# Patient Record
Sex: Female | Born: 1945 | Race: White | Hispanic: No | Marital: Married | Smoking: Never smoker
Health system: Southern US, Community
[De-identification: ages and names within clinical notes are randomized; demographics above are authoritative.]

## PROBLEM LIST (undated history)

## (undated) DIAGNOSIS — F32A Depression, unspecified: Secondary | ICD-10-CM

## (undated) DIAGNOSIS — I10 Essential (primary) hypertension: Secondary | ICD-10-CM

## (undated) DIAGNOSIS — R011 Cardiac murmur, unspecified: Secondary | ICD-10-CM

## (undated) DIAGNOSIS — J189 Pneumonia, unspecified organism: Secondary | ICD-10-CM

## (undated) DIAGNOSIS — M1990 Unspecified osteoarthritis, unspecified site: Secondary | ICD-10-CM

## (undated) DIAGNOSIS — D649 Anemia, unspecified: Secondary | ICD-10-CM

## (undated) DIAGNOSIS — G4730 Sleep apnea, unspecified: Secondary | ICD-10-CM

## (undated) DIAGNOSIS — C801 Malignant (primary) neoplasm, unspecified: Secondary | ICD-10-CM

## (undated) DIAGNOSIS — R519 Headache, unspecified: Secondary | ICD-10-CM

## (undated) HISTORY — PX: APPENDECTOMY: SHX54

## (undated) HISTORY — PX: COLONOSCOPY: SHX174

## (undated) HISTORY — PX: EYE SURGERY: SHX253

## (undated) HISTORY — PX: COLON SURGERY: SHX602

## (undated) HISTORY — PX: HERNIA REPAIR: SHX51

---

## 2009-08-14 ENCOUNTER — Ambulatory Visit: Payer: Self-pay | Admitting: Diagnostic Radiology

## 2009-08-14 ENCOUNTER — Emergency Department (HOSPITAL_BASED_OUTPATIENT_CLINIC_OR_DEPARTMENT_OTHER): Admission: EM | Admit: 2009-08-14 | Discharge: 2009-08-14 | Payer: Self-pay | Admitting: Emergency Medicine

## 2010-06-26 IMAGING — CR DG CHEST 2V
2 series · 2 of 2 positions shown · non-contrast
Comparison: None available.

CLINICAL DATA: Elevated blood pressure, nausea and heart
palpitations.

CHEST - 2 VIEW

[w chest pa]
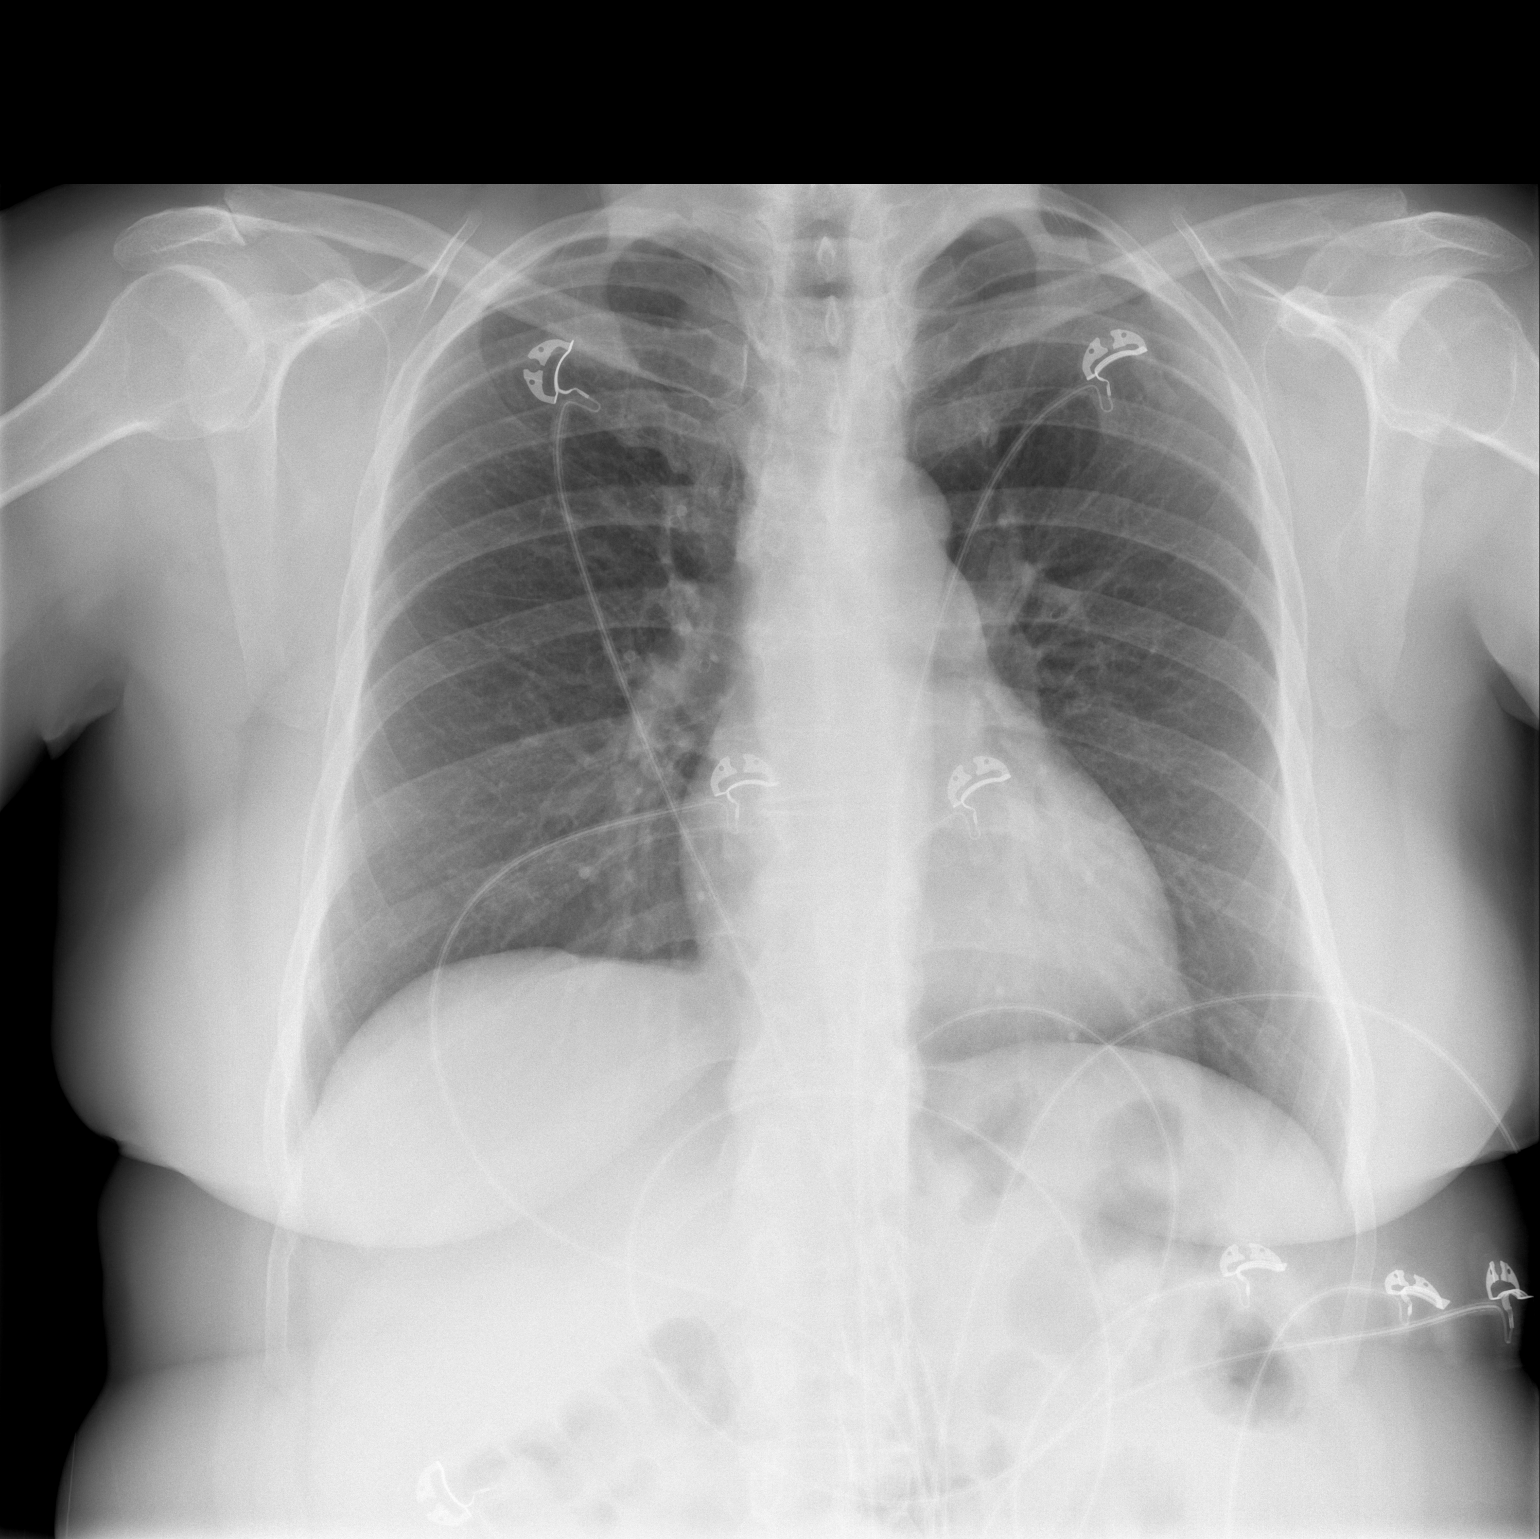

[w chest lat]
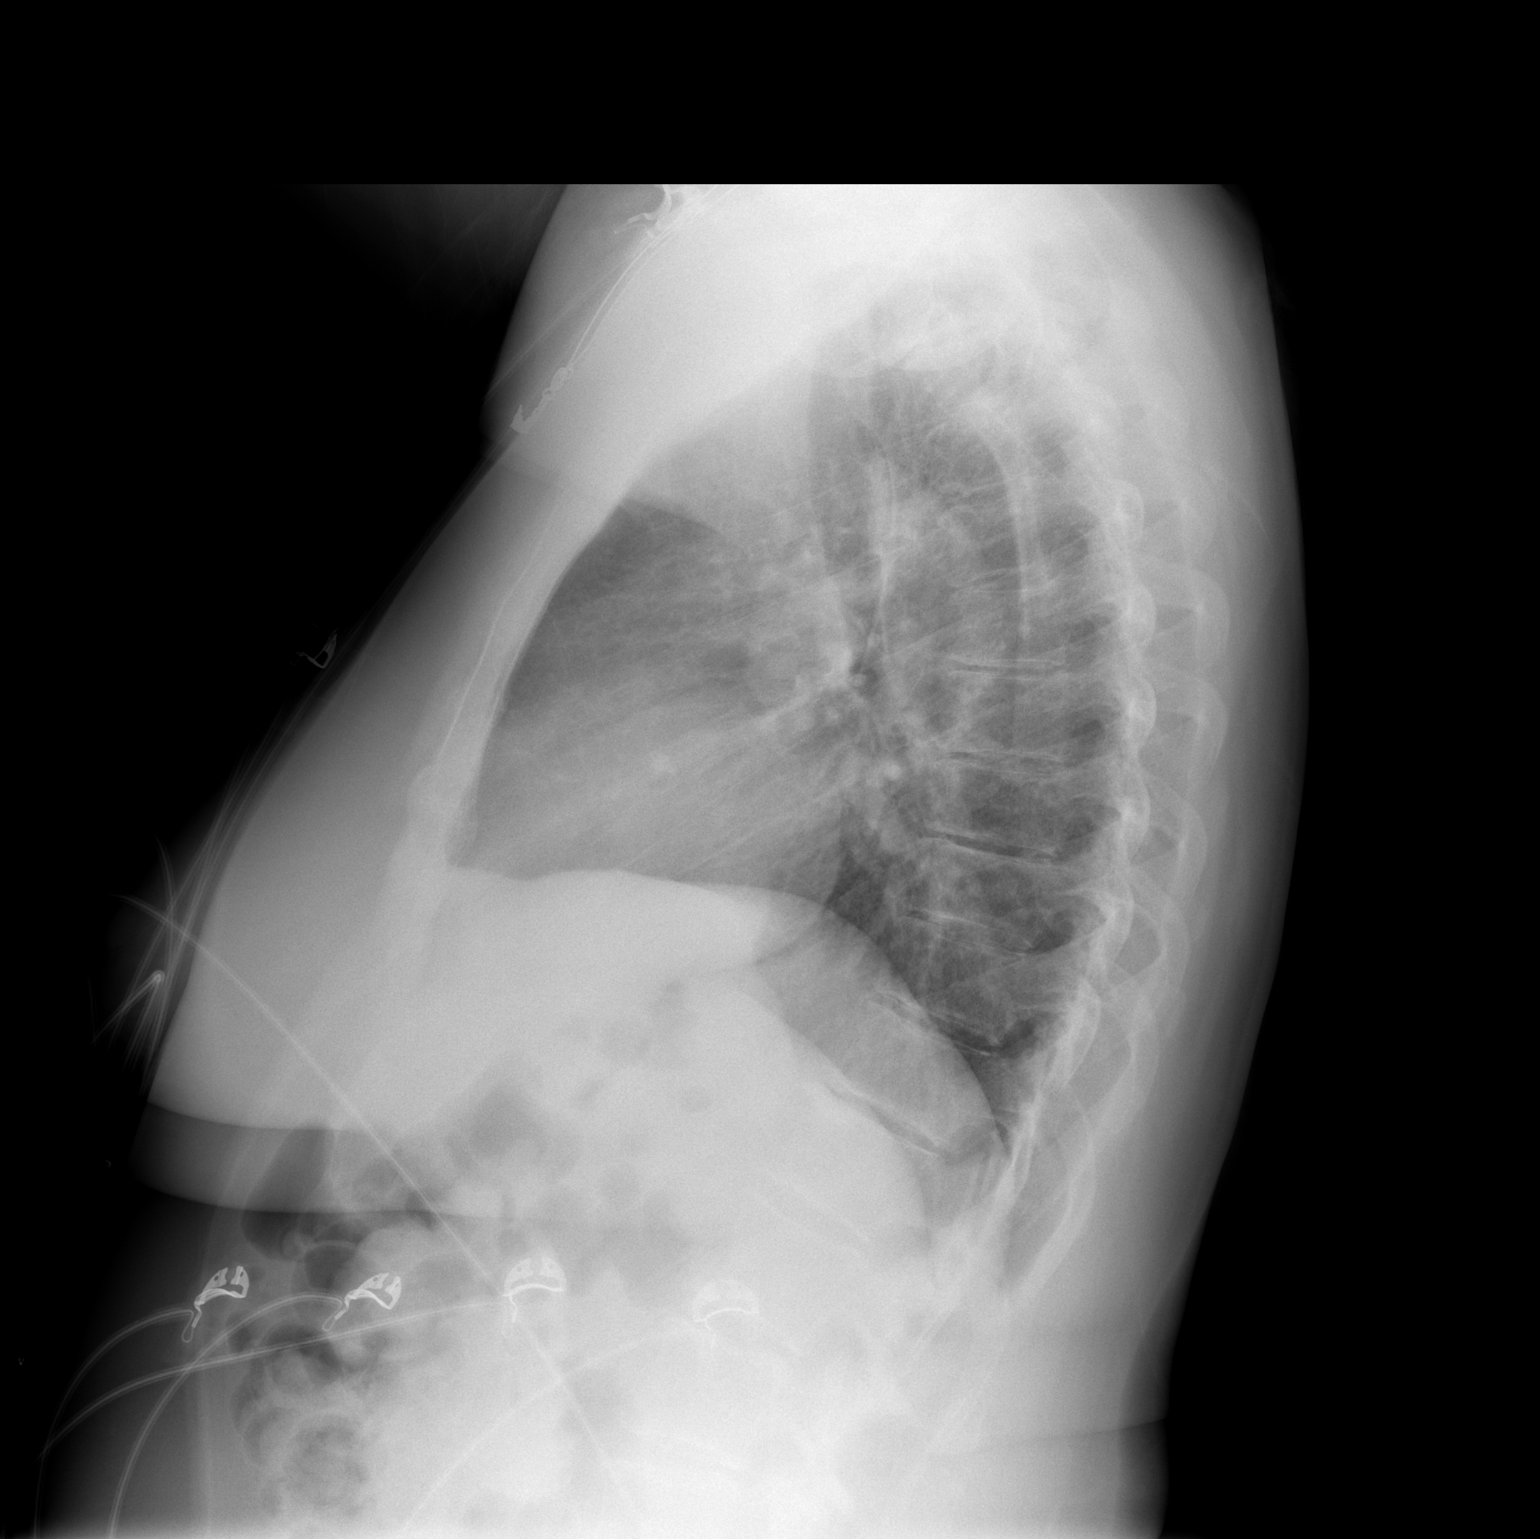

[2 of 2 positions shown; findings below may reference images not displayed]

FINDINGS: The lungs are clear.  Heart size is normal.  There is no
pleural effusion.
IMPRESSION: Negative chest.

## 2010-11-25 LAB — CBC
HCT: 38.4 % (ref 36.0–46.0)
MCHC: 34.5 g/dL (ref 30.0–36.0)
MCV: 91.7 fL (ref 78.0–100.0)
Platelets: 263 10*3/uL (ref 150–400)
WBC: 7.7 10*3/uL (ref 4.0–10.5)

## 2010-11-25 LAB — DIFFERENTIAL
Basophils Relative: 0 % (ref 0–1)
Eosinophils Absolute: 0 10*3/uL (ref 0.0–0.7)
Eosinophils Relative: 1 % (ref 0–5)
Lymphs Abs: 1.1 10*3/uL (ref 0.7–4.0)
Monocytes Relative: 5 % (ref 3–12)

## 2010-11-25 LAB — BASIC METABOLIC PANEL
BUN: 15 mg/dL (ref 6–23)
CO2: 27 mEq/L (ref 19–32)
Chloride: 105 mEq/L (ref 96–112)
Creatinine, Ser: 0.8 mg/dL (ref 0.4–1.2)
Potassium: 4.2 mEq/L (ref 3.5–5.1)

## 2010-11-25 LAB — POCT CARDIAC MARKERS

## 2019-11-30 ENCOUNTER — Telehealth: Payer: Self-pay

## 2019-12-01 NOTE — Telephone Encounter (Signed)
Encounter opened in error

## 2021-01-25 ENCOUNTER — Encounter (HOSPITAL_COMMUNITY): Payer: Self-pay | Admitting: Ophthalmology

## 2021-01-25 ENCOUNTER — Other Ambulatory Visit: Payer: Self-pay

## 2021-01-25 NOTE — Progress Notes (Signed)
Spoke with pt for pre-op call. Pt denies cardiac history (except for a heart murmur that she has had for 70+ years). Pt states she has been treated for HTN in the past but has not been on medications for several years. Pt is not diabetic.   Pt's surgery is scheduled as ambulatory so no Covid test is required prior to surgery. Pt denies any Covid symptoms.

## 2021-01-29 ENCOUNTER — Other Ambulatory Visit: Payer: Self-pay

## 2021-01-29 ENCOUNTER — Ambulatory Visit (HOSPITAL_COMMUNITY): Payer: Medicare HMO | Admitting: Anesthesiology

## 2021-01-29 ENCOUNTER — Encounter (HOSPITAL_COMMUNITY)
Admission: RE | Disposition: A | Discharge status: Home / Self Care | Payer: Self-pay | Source: Ambulatory Visit | Attending: Ophthalmology

## 2021-01-29 ENCOUNTER — Encounter (HOSPITAL_COMMUNITY): Payer: Self-pay | Admitting: Ophthalmology

## 2021-01-29 ENCOUNTER — Ambulatory Visit (HOSPITAL_COMMUNITY)
Admission: RE | Admit: 2021-01-29 | Discharge: 2021-01-29 | Disposition: A | Discharge status: Home / Self Care | Payer: Medicare HMO | Source: Ambulatory Visit | Attending: Ophthalmology | Admitting: Ophthalmology

## 2021-01-29 ENCOUNTER — Ambulatory Visit: Payer: Self-pay | Admitting: Ophthalmology

## 2021-01-29 DIAGNOSIS — R899 Unspecified abnormal finding in specimens from other organs, systems and tissues: Secondary | ICD-10-CM | POA: Diagnosis not present

## 2021-01-29 DIAGNOSIS — H43391 Other vitreous opacities, right eye: Secondary | ICD-10-CM | POA: Diagnosis present

## 2021-01-29 HISTORY — DX: Headache, unspecified: R51.9

## 2021-01-29 HISTORY — DX: Unspecified osteoarthritis, unspecified site: M19.90

## 2021-01-29 HISTORY — DX: Pneumonia, unspecified organism: J18.9

## 2021-01-29 HISTORY — DX: Cardiac murmur, unspecified: R01.1

## 2021-01-29 HISTORY — DX: Malignant (primary) neoplasm, unspecified: C80.1

## 2021-01-29 HISTORY — DX: Anemia, unspecified: D64.9

## 2021-01-29 HISTORY — DX: Sleep apnea, unspecified: G47.30

## 2021-01-29 HISTORY — DX: Depression, unspecified: F32.A

## 2021-01-29 HISTORY — PX: PHOTOCOAGULATION WITH LASER: SHX6027

## 2021-01-29 HISTORY — DX: Essential (primary) hypertension: I10

## 2021-01-29 HISTORY — PX: PARS PLANA VITRECTOMY: SHX2166

## 2021-01-29 LAB — BASIC METABOLIC PANEL
Anion gap: 8 (ref 5–15)
BUN: 19 mg/dL (ref 8–23)
CO2: 23 mmol/L (ref 22–32)
Calcium: 9.4 mg/dL (ref 8.9–10.3)
Chloride: 107 mmol/L (ref 98–111)
Creatinine, Ser: 0.98 mg/dL (ref 0.44–1.00)
GFR, Estimated: >60 mL/min (ref >60)
Glucose, Bld: 101 mg/dL — ABNORMAL HIGH (ref 70–99)
Potassium: 3.7 mmol/L (ref 3.5–5.1)
Sodium: 138 mmol/L (ref 135–145)

## 2021-01-29 SURGERY — PARS PLANA VITRECTOMY 25 GAUGE FOR ENDOPHTHALMITIS
Anesthesia: Monitor Anesthesia Care | Site: Eye | Laterality: Right

## 2021-01-29 MED ORDER — PROPOFOL 10 MG/ML IV BOLUS
INTRAVENOUS | Status: DC | PRN
  Administered 2021-01-29: 20 mg via INTRAVENOUS
  Administered 2021-01-29: 25 mg via INTRAVENOUS
  Administered 2021-01-29: 15 mg via INTRAVENOUS
  Administered 2021-01-29: 20 mg via INTRAVENOUS

## 2021-01-29 MED ORDER — ATROPINE SULFATE 1 % OP SOLN
OPHTHALMIC | Status: AC
  Filled 2021-01-29: qty 5

## 2021-01-29 MED ORDER — EPINEPHRINE PF 1 MG/ML IJ SOLN
INTRAOCULAR | Status: DC | PRN
  Administered 2021-01-29: 500 mL

## 2021-01-29 MED ORDER — CHLORHEXIDINE GLUCONATE 0.12 % MT SOLN
15.0000 mL | Freq: Once | OROMUCOSAL | Status: AC
  Administered 2021-01-29: 15 mL via OROMUCOSAL
  Filled 2021-01-29: qty 15

## 2021-01-29 MED ORDER — ORAL CARE MOUTH RINSE: 15.0000 mL | Freq: Once | OROMUCOSAL | Status: AC

## 2021-01-29 MED ORDER — ATROPINE SULFATE 1 % OP SOLN
OPHTHALMIC | Status: DC | PRN
  Administered 2021-01-29: 1 [drp] via OPHTHALMIC

## 2021-01-29 MED ORDER — PHENYLEPHRINE 40 MCG/ML (10ML) SYRINGE FOR IV PUSH (FOR BLOOD PRESSURE SUPPORT)
PREFILLED_SYRINGE | INTRAVENOUS | Status: AC
  Filled 2021-01-29: qty 10

## 2021-01-29 MED ORDER — DEXAMETHASONE SODIUM PHOSPHATE 10 MG/ML IJ SOLN
INTRAMUSCULAR | Status: AC
  Filled 2021-01-29: qty 1

## 2021-01-29 MED ORDER — FENTANYL CITRATE (PF) 100 MCG/2ML IJ SOLN
INTRAMUSCULAR | Status: AC
  Filled 2021-01-29: qty 2

## 2021-01-29 MED ORDER — EPINEPHRINE PF 1 MG/ML IJ SOLN
INTRAMUSCULAR | Status: AC
  Filled 2021-01-29: qty 1

## 2021-01-29 MED ORDER — FENTANYL CITRATE (PF) 250 MCG/5ML IJ SOLN
INTRAMUSCULAR | Status: AC
  Filled 2021-01-29: qty 5

## 2021-01-29 MED ORDER — FENTANYL CITRATE (PF) 100 MCG/2ML IJ SOLN
25.0000 ug | INTRAMUSCULAR | Status: DC | PRN
  Administered 2021-01-29 (×2): 25 ug via INTRAVENOUS

## 2021-01-29 MED ORDER — BSS IO SOLN
INTRAOCULAR | Status: DC | PRN
  Administered 2021-01-29: 15 mL

## 2021-01-29 MED ORDER — INDOCYANINE GREEN 25 MG IV SOLR
INTRAVENOUS | Status: AC
  Filled 2021-01-29: qty 10

## 2021-01-29 MED ORDER — PROPOFOL 10 MG/ML IV BOLUS
INTRAVENOUS | Status: AC
  Filled 2021-01-29: qty 20

## 2021-01-29 MED ORDER — OXYCODONE HCL 5 MG PO TABS: 5.0000 mg | ORAL_TABLET | Freq: Once | ORAL | Status: DC | PRN

## 2021-01-29 MED ORDER — CEFAZOLIN SUBCONJUNCTIVAL INJECTION 100 MG/0.5 ML
100.0000 mg | INJECTION | SUBCONJUNCTIVAL | Status: DC
  Filled 2021-01-29: qty 5

## 2021-01-29 MED ORDER — TETRACAINE HCL 0.5 % OP SOLN
OPHTHALMIC | Status: AC
  Filled 2021-01-29: qty 4

## 2021-01-29 MED ORDER — MIDAZOLAM HCL 5 MG/5ML IJ SOLN
INTRAMUSCULAR | Status: DC | PRN
  Administered 2021-01-29 (×2): 1 mg via INTRAVENOUS

## 2021-01-29 MED ORDER — MIDAZOLAM HCL 2 MG/2ML IJ SOLN
INTRAMUSCULAR | Status: AC
  Filled 2021-01-29: qty 2

## 2021-01-29 MED ORDER — OXYCODONE HCL 5 MG/5ML PO SOLN: 5.0000 mg | Freq: Once | ORAL | Status: DC | PRN

## 2021-01-29 MED ORDER — TOBRAMYCIN-DEXAMETHASONE 0.3-0.1 % OP OINT
TOPICAL_OINTMENT | OPHTHALMIC | Status: DC | PRN
  Administered 2021-01-29: 1 via OPHTHALMIC

## 2021-01-29 MED ORDER — ONDANSETRON HCL 4 MG/2ML IJ SOLN
INTRAMUSCULAR | Status: DC | PRN
  Administered 2021-01-29: 4 mg via INTRAVENOUS

## 2021-01-29 MED ORDER — FENTANYL CITRATE (PF) 100 MCG/2ML IJ SOLN
INTRAMUSCULAR | Status: DC | PRN
  Administered 2021-01-29: 50 ug via INTRAVENOUS
  Administered 2021-01-29 (×3): 25 ug via INTRAVENOUS

## 2021-01-29 MED ORDER — OFLOXACIN 0.3 % OP SOLN
1.0000 [drp] | OPHTHALMIC | Status: DC | PRN
  Administered 2021-01-29: 1 [drp] via OPHTHALMIC
  Filled 2021-01-29: qty 5

## 2021-01-29 MED ORDER — ONDANSETRON HCL 4 MG/2ML IJ SOLN: 4.0000 mg | Freq: Once | INTRAMUSCULAR | Status: DC | PRN

## 2021-01-29 MED ORDER — HYALURONIDASE HUMAN 150 UNIT/ML IJ SOLN
INTRAMUSCULAR | Status: AC
  Filled 2021-01-29: qty 1

## 2021-01-29 MED ORDER — LIDOCAINE HCL 2 % IJ SOLN
INTRAMUSCULAR | Status: AC
  Filled 2021-01-29: qty 20

## 2021-01-29 MED ORDER — VANCOMYCIN SUBCONJUNCTIVAL INJECTION 25 MG/0.5 ML
25.0000 mg | INTRAOCULAR | Status: AC
  Administered 2021-01-29: 25 mg via SUBCONJUNCTIVAL
  Filled 2021-01-29: qty 0

## 2021-01-29 MED ORDER — PROPARACAINE HCL 0.5 % OP SOLN
1.0000 [drp] | OPHTHALMIC | Status: DC | PRN
  Administered 2021-01-29: 1 [drp] via OPHTHALMIC
  Filled 2021-01-29: qty 15

## 2021-01-29 MED ORDER — TOBRAMYCIN-DEXAMETHASONE 0.3-0.1 % OP OINT
TOPICAL_OINTMENT | OPHTHALMIC | Status: AC
  Filled 2021-01-29: qty 3.5

## 2021-01-29 MED ORDER — CEFTAZIDIME INTRAVITREAL INJECTION 2.25 MG/0.1 ML
2.2500 mg | INTRAVITREAL | Status: DC
  Filled 2021-01-29 (×2): qty 0.1

## 2021-01-29 MED ORDER — PHENYLEPHRINE HCL 2.5 % OP SOLN
1.0000 [drp] | OPHTHALMIC | Status: DC | PRN
  Administered 2021-01-29: 1 [drp] via OPHTHALMIC
  Filled 2021-01-29: qty 2

## 2021-01-29 MED ORDER — SODIUM CHLORIDE 0.9 % IV SOLN
1.0000 g | Freq: Once | INTRAVENOUS | Status: DC
  Filled 2021-01-29 (×2): qty 1

## 2021-01-29 MED ORDER — CYCLOPENTOLATE HCL 1 % OP SOLN
1.0000 [drp] | OPHTHALMIC | Status: DC | PRN
  Administered 2021-01-29: 1 [drp] via OPHTHALMIC
  Filled 2021-01-29: qty 2

## 2021-01-29 MED ORDER — DEXAMETHASONE SODIUM PHOSPHATE 10 MG/ML IJ SOLN
INTRAMUSCULAR | Status: DC | PRN
  Administered 2021-01-29: 1 mL

## 2021-01-29 MED ORDER — HYPROMELLOSE (GONIOSCOPIC) 2.5 % OP SOLN
OPHTHALMIC | Status: DC | PRN
  Administered 2021-01-29: 2 [drp] via OPHTHALMIC

## 2021-01-29 MED ORDER — AMISULPRIDE (ANTIEMETIC) 5 MG/2ML IV SOLN: 10.0000 mg | Freq: Once | INTRAVENOUS | Status: DC | PRN

## 2021-01-29 MED ORDER — SODIUM CHLORIDE 0.9 % IV SOLN: INTRAVENOUS | Status: DC

## 2021-01-29 MED ORDER — BUPIVACAINE HCL (PF) 0.75 % IJ SOLN
INTRAMUSCULAR | Status: AC
  Filled 2021-01-29: qty 10

## 2021-01-29 MED ORDER — BSS IO SOLN
INTRAOCULAR | Status: AC
  Filled 2021-01-29: qty 15

## 2021-01-29 MED ORDER — HYPROMELLOSE (GONIOSCOPIC) 2.5 % OP SOLN
OPHTHALMIC | Status: AC
  Filled 2021-01-29: qty 15

## 2021-01-29 MED ORDER — LIDOCAINE HCL 2 % IJ SOLN
INTRAMUSCULAR | Status: DC | PRN
  Administered 2021-01-29: 6 mL via RETROBULBAR

## 2021-01-29 MED ORDER — BSS PLUS IO SOLN
INTRAOCULAR | Status: AC
  Filled 2021-01-29: qty 500

## 2021-01-29 MED ORDER — ONDANSETRON HCL 4 MG/2ML IJ SOLN
INTRAMUSCULAR | Status: AC
  Filled 2021-01-29: qty 2

## 2021-01-29 SURGICAL SUPPLY — 62 items
APL SWBSTK 6 STRL LF DISP (MISCELLANEOUS) ×1
APPLICATOR COTTON TIP 6 STRL (MISCELLANEOUS) ×1 IMPLANT
APPLICATOR COTTON TIP 6IN STRL (MISCELLANEOUS) ×2
BAND WRIST GAS GREEN (MISCELLANEOUS) IMPLANT
BLADE MVR KNIFE 20G (BLADE) IMPLANT
CANNULA ANT CHAM MAIN (OPHTHALMIC RELATED) IMPLANT
CANNULA DUAL BORE 23G (CANNULA) IMPLANT
CANNULA DUALBORE 25G (CANNULA) IMPLANT
CANNULA VLV SOFT TIP 25G (OPHTHALMIC) ×1 IMPLANT
CANNULA VLV SOFT TIP 25GA (OPHTHALMIC) ×2 IMPLANT
CAUTERY EYE LOW TEMP 1300F FIN (OPHTHALMIC RELATED) IMPLANT
CLSR STERI-STRIP ANTIMIC 1/2X4 (GAUZE/BANDAGES/DRESSINGS) ×2 IMPLANT
CORD BIPOLAR FORCEPS 12FT (ELECTRODE) IMPLANT
COVER MAYO STAND STRL (DRAPES) IMPLANT
COVER WAND RF STERILE (DRAPES) ×2 IMPLANT
DRAPE HALF SHEET 40X57 (DRAPES) ×2 IMPLANT
DRAPE INCISE 51X51 W/FILM STRL (DRAPES) IMPLANT
DRAPE RETRACTOR (MISCELLANEOUS) ×2 IMPLANT
ERASER HMR WETFIELD 23G BP (MISCELLANEOUS) IMPLANT
GAS WRIST BAND GREEN (MISCELLANEOUS)
GLOVE SURG SYN 7.5  E (GLOVE) ×4
GLOVE SURG SYN 7.5 E (GLOVE) ×2 IMPLANT
GLOVE SURG SYN 7.5 PF PI (GLOVE) ×2 IMPLANT
GOWN STRL REUS W/ TWL LRG LVL3 (GOWN DISPOSABLE) ×1 IMPLANT
GOWN STRL REUS W/TWL LRG LVL3 (GOWN DISPOSABLE) ×2
HANDLE PNEUMATIC FOR CONSTEL (OPHTHALMIC) IMPLANT
KIT BASIN OR (CUSTOM PROCEDURE TRAY) ×2 IMPLANT
KIT TURNOVER KIT B (KITS) ×2 IMPLANT
LENS BIOM SUPER VIEW SET DISP (MISCELLANEOUS) ×2 IMPLANT
MICROPICK 25G (MISCELLANEOUS)
NDL 18GX1X1/2 (RX/OR ONLY) (NEEDLE) ×1 IMPLANT
NDL 25GX 5/8IN NON SAFETY (NEEDLE) ×1 IMPLANT
NDL 27GX1/2 REG BEVEL ECLIP (NEEDLE) ×1 IMPLANT
NDL FILTER BLUNT 18X1 1/2 (NEEDLE) ×3 IMPLANT
NDL HYPO 25GX1X1/2 BEV (NEEDLE) IMPLANT
NDL HYPO 30X.5 LL (NEEDLE) ×4 IMPLANT
NDL RETROBULBAR 25GX1.5 (NEEDLE) IMPLANT
NEEDLE 18GX1X1/2 (RX/OR ONLY) (NEEDLE) ×2 IMPLANT
NEEDLE 25GX 5/8IN NON SAFETY (NEEDLE) ×2 IMPLANT
NEEDLE 27GX1/2 REG BEVEL ECLIP (NEEDLE) ×2 IMPLANT
NEEDLE FILTER BLUNT 18X 1/2SAF (NEEDLE) ×3
NEEDLE FILTER BLUNT 18X1 1/2 (NEEDLE) ×3 IMPLANT
NEEDLE HYPO 25GX1X1/2 BEV (NEEDLE) IMPLANT
NEEDLE HYPO 30X.5 LL (NEEDLE) ×8 IMPLANT
NEEDLE RETROBULBAR 25GX1.5 (NEEDLE) IMPLANT
NS IRRIG 1000ML POUR BTL (IV SOLUTION) ×2 IMPLANT
PACK VITRECTOMY CUSTOM (CUSTOM PROCEDURE TRAY) ×2 IMPLANT
PAD ARMBOARD 7.5X6 YLW CONV (MISCELLANEOUS) ×4 IMPLANT
PAK PIK VITRECTOMY CVS 25GA (OPHTHALMIC) ×2 IMPLANT
PICK MICROPICK 25G (MISCELLANEOUS) IMPLANT
PROBE ENDO DIATHERMY 25G (MISCELLANEOUS) IMPLANT
PROBE LASER ILLUM FLEX CVD 25G (OPHTHALMIC) ×1 IMPLANT
SCRAPER DIAMOND 25GA (OPHTHALMIC RELATED) IMPLANT
SOL ANTI FOG 6CC (MISCELLANEOUS) ×1 IMPLANT
SOLUTION ANTI FOG 6CC (MISCELLANEOUS) ×1
SUT VICRYL 7 0 TG140 8 (SUTURE) ×2 IMPLANT
SYR 10ML LL (SYRINGE) IMPLANT
SYR 20ML LL LF (SYRINGE) ×2 IMPLANT
SYR 5ML LL (SYRINGE) ×2 IMPLANT
SYR TB 1ML LUER SLIP (SYRINGE) ×6 IMPLANT
TOWEL GREEN STERILE FF (TOWEL DISPOSABLE) ×2 IMPLANT
WATER STERILE IRR 1000ML POUR (IV SOLUTION) ×2 IMPLANT

## 2021-01-29 NOTE — Anesthesia Preprocedure Evaluation (Addendum)
Anesthesia Evaluation  Patient identified by MRN, date of birth, ID band Patient awake    Reviewed: Allergy & Precautions, NPO status , Patient's Chart, lab work & pertinent test results  History of Anesthesia Complications Negative for: history of anesthetic complications  Airway Mallampati: III  TM Distance: >3 FB Neck ROM: Full    Dental  (+) Teeth Intact   Pulmonary sleep apnea ,    Pulmonary exam normal        Cardiovascular hypertension, Normal cardiovascular exam     Neuro/Psych negative neurological ROS     GI/Hepatic negative GI ROS, Neg liver ROS,   Endo/Other  negative endocrine ROS  Renal/GU negative Renal ROS  negative genitourinary   Musculoskeletal  (+) Arthritis ,   Abdominal   Peds  Hematology H/o lymphoma   Anesthesia Other Findings   Reproductive/Obstetrics                            Anesthesia Physical Anesthesia Plan  ASA: II  Anesthesia Plan: MAC   Post-op Pain Management:    Induction: Intravenous  PONV Risk Score and Plan: 2 and TIVA and Treatment may vary due to age or medical condition  Airway Management Planned: Natural Airway, Nasal Cannula and Simple Face Mask  Additional Equipment: None  Intra-op Plan:   Post-operative Plan:   Informed Consent: I have reviewed the patients History and Physical, chart, labs and discussed the procedure including the risks, benefits and alternatives for the proposed anesthesia with the patient or authorized representative who has indicated his/her understanding and acceptance.       Plan Discussed with:   Anesthesia Plan Comments:        Anesthesia Quick Evaluation

## 2021-01-29 NOTE — H&P (Deleted)
  The note originally documented on this encounter has been moved the the encounter in which it belongs.  

## 2021-01-29 NOTE — Brief Op Note (Signed)
01/29/2021  4:33 PM  PATIENT:  Kristina Bishop  75 y.o. female  PRE-OPERATIVE DIAGNOSIS:  OTHER VITROUS OPACITIES RIGHT EYE  POST-OPERATIVE DIAGNOSIS:  OTHER VITROUS OPACITIES RIGHT EYE  PROCEDURE:  Procedure(s): PARS PLANA VITRECTOMY 25 GAUGE, VITREOUS BIOPSY AND ENDOLASER(Right)  SURGEON:  Surgeon(s) and Role:    * Jalene Mullet, MD - Primary  PHYSICIAN ASSISTANT:   ASSISTANTS: none   ANESTHESIA:   local and MAC  EBL:  MINIMAL   BLOOD ADMINISTERED:none  DRAINS: none   LOCAL MEDICATIONS USED:  MARCAINE    and LIDOCAINE   SPECIMEN:  Source of Specimen:  VITREOUS  DISPOSITION OF SPECIMEN:  PATHOLOGY FOR FLOW CYTOMETRY  COUNTS:  YES  TOURNIQUET:  * No tourniquets in log *  DICTATION: .Note written in EPIC  PLAN OF CARE: Discharge to home after PACU  PATIENT DISPOSITION:  PACU - hemodynamically stable.   Delay start of Pharmacological VTE agent (>24hrs) due to surgical blood loss or risk of bleeding: not applicable

## 2021-01-29 NOTE — Anesthesia Procedure Notes (Signed)
Procedure Name: MAC Date/Time: 01/29/2021 3:38 PM Performed by: Inda Coke, CRNA Pre-anesthesia Checklist: Patient identified, Emergency Drugs available, Suction available, Timeout performed and Patient being monitored Patient Re-evaluated:Patient Re-evaluated prior to induction Oxygen Delivery Method: Nasal cannula Induction Type: IV induction Dental Injury: Teeth and Oropharynx as per pre-operative assessment

## 2021-01-29 NOTE — Discharge Instructions (Addendum)
DO NOT SLEEP ON BACK, THE EYE PRESSURE CAN GO UP AND CAUSE VISION LOSS   SLEEP ON SIDE WITH NOSE TO PILLOW  DURING DAY KEEP UPRIGHT 

## 2021-01-29 NOTE — Op Note (Signed)
Kristina Bishop 01/29/2021 Diagnosis: Vitreous Opacities  Procedure: Pars Plana Vitrectomy, Endolaser and dry tap for vitreous biopsy Operative Eye:  right eye  Surgeon: Royston Cowper Estimated Blood Loss: minimal Specimens for Pathology:  None Complications: none   The  patient was prepped and draped in the usual fashion for ocular surgery on the  right eye .  A lid speculum was placed.  Infusion line and trocar was placed at the 8 o'clock position approximately 3.5 mm from the surgical limbus.   The infusion line was allowed to run and then clamped when placed at the cannula opening. The line was inserted and secured to the drape with an adhesive strip.   Active trocars/cannula were placed at the 10 and 2 o'clock positions approximately 3.5 mm from the surgical limbus. The cannula was visualized in the vitreous cavity.  The light pipe and vitreous cutter were inserted into the vitreous cavity and a dry tap was performed with a 5 cc syringe and with infusion clamped.  Approximately 2.5cc of vitreous biopsy was obtained and sent for stat flow cytometry/cytology.  A core vitrectomy was then performed with infusion unclamped.  Care taken to remove the vitreous up to the vitreous base for 360 degrees.   3 rows of endolaser were applied 360 degrees to the periphery.  A partial air-fluid exchange was performed.  The superior cannulas were sequentially removed with concommitant tamponade using a cotton tipped applicator and noted to be air tight.  The infusion line and trocar were removed and the sclerotomy was noted to be air tight with normal intraocular pressure by digital palpapation.  Subconjunctival injections of Vancomycin and  Dexamethasone 4mg /67ml were placed in the infero-medial quadrant.   The speculum and drapes were removed and the eye was patched with Polymixin/Bacitracin ophthalmic ointment. An eye shield was placed and the patient was transferred alert and conversant with stable  vital signs to the post operative recovery area.  The patient tolerated the procedure well and no complications were noted.  Royston Cowper MD

## 2021-01-29 NOTE — Anesthesia Postprocedure Evaluation (Signed)
Anesthesia Post Note  Patient: Kristina Bishop  Procedure(s) Performed: PARS PLANA VITRECTOMY 25 GAUGE FOR ENDOPHTHALMITIS, right eye vitreous biopsy for frlow cytometry (Right Eye) PHOTOCOAGULATION WITH LASER (Right Eye)     Patient location during evaluation: PACU Anesthesia Type: MAC Level of consciousness: awake and alert Pain management: pain level controlled Vital Signs Assessment: post-procedure vital signs reviewed and stable Respiratory status: spontaneous breathing, nonlabored ventilation and respiratory function stable Cardiovascular status: blood pressure returned to baseline and stable Postop Assessment: no apparent nausea or vomiting Anesthetic complications: no   No complications documented.  Last Vitals:  Vitals:   01/29/21 1653 01/29/21 1709  BP: (!) 126/99 128/71  Pulse: 84   Resp: 19   Temp:  36.5 C  SpO2: 100%     Last Pain:  Vitals:   01/29/21 1421  TempSrc: Oral                 Lidia Collum

## 2021-01-29 NOTE — H&P (Signed)
Date of examination:  01/29/21  Indication for surgery: Vitreous opacities left eye  Pertinent past medical history:  Past Medical History:  Diagnosis Date  . Anemia    after chemo  . Arthritis   . Cancer ([place])    lymphoma  . Depression   . Headache    occasional migraine  . Heart murmur    for over 70 years  . Hypertension    no longer on medications  . Pneumonia   . Sleep apnea    no longer needs a cpap    Pertinent ocular history:  Vitreous opacities  Pertinent family history: No family history on file.  General:  Healthy appearing patient in no distress.    Eyes:    Acuity OD 20/100   External: Within normal limits     Anterior segment: Within normal limits     Fundus: Vitreous opacities     Impression: Vitreous opacities right eye  Plan: Vitrectomy and biopsy for flow cytometry with endolaser right eye  Jalene Mullet, MD

## 2021-01-29 NOTE — Transfer of Care (Signed)
Immediate Anesthesia Transfer of Care Note  Patient: Kristina Bishop  Procedure(s) Performed: PARS PLANA VITRECTOMY 25 GAUGE FOR ENDOPHTHALMITIS (Right )  Patient Location: PACU  Anesthesia Type:MAC  Level of Consciousness: awake, alert  and oriented  Airway & Oxygen Therapy: Patient Spontanous Breathing  Post-op Assessment: Report given to RN and Post -op Vital signs reviewed and stable  Post vital signs: Reviewed and stable  Last Vitals:  Vitals Value Taken Time  BP 136/73 01/29/21 1638  Temp    Pulse 88 01/29/21 1641  Resp 21 01/29/21 1641  SpO2 100 % 01/29/21 1641  Vitals shown include unvalidated device data.  Last Pain:  Vitals:   01/29/21 1421  TempSrc: Oral         Complications: No complications documented.

## 2021-01-30 ENCOUNTER — Encounter (HOSPITAL_COMMUNITY): Payer: Self-pay | Admitting: Ophthalmology

## 2021-01-30 MED FILL — Vancomycin HCl For IV Soln 1 GM (Base Equivalent): INTRAOCULAR | Qty: 25 | Status: AC

## 2021-01-31 LAB — SURGICAL PATHOLOGY

## 2021-01-31 LAB — CYTOLOGY - NON PAP
